# Patient Record
Sex: Female | Born: 1985 | Race: White | Hispanic: No | Marital: Married
Health system: Midwestern US, Academic
[De-identification: ages and names within clinical notes are randomized; demographics above are authoritative.]

---

## 2017-06-25 ENCOUNTER — Encounter: Admit: 2017-06-25 | Discharge: 2017-06-25 | Payer: BC Managed Care – PPO | Primary: Family

## 2017-06-25 ENCOUNTER — Encounter: Admit: 2017-06-25 | Discharge: 2017-06-26 | Payer: BC Managed Care – PPO | Primary: Family

## 2017-06-26 ENCOUNTER — Encounter: Admit: 2017-06-26 | Discharge: 2017-06-26 | Payer: BC Managed Care – PPO | Primary: Family

## 2017-06-26 ENCOUNTER — Encounter: Admit: 2017-06-26 | Discharge: 2017-06-27 | Payer: BC Managed Care – PPO | Primary: Family

## 2017-06-26 ENCOUNTER — Observation Stay: Admit: 2017-06-26 | Discharge: 2017-06-26 | Payer: BC Managed Care – PPO | Primary: Family

## 2017-06-26 DIAGNOSIS — T18108A Unspecified foreign body in esophagus causing other injury, initial encounter: ICD-10-CM

## 2017-06-26 LAB — URINALYSIS DIPSTICK
Lab: NEGATIVE
Lab: NEGATIVE
Lab: NEGATIVE
Lab: NEGATIVE
Lab: NEGATIVE

## 2017-06-26 LAB — CBC AND DIFF
Lab: 0 % (ref >60)
Lab: 0 10*3/uL (ref 0–0.20)
Lab: 0.2 10*3/uL (ref 0–0.45)
Lab: 0.5 10*3/uL (ref 0–0.80)
Lab: 13 % (ref 11–15)
Lab: 14 g/dL (ref 12.0–15.0)
Lab: 2 % (ref 0–5)
Lab: 259 10*3/uL (ref 150–400)
Lab: 3.3 10*3/uL (ref 1.0–4.8)
Lab: 32 pg (ref 26–34)
Lab: 33 % (ref 24–44)
Lab: 33 g/dL (ref 32.0–36.0)
Lab: 4.4 M/UL (ref 4.0–5.0)
Lab: 42 % (ref 36–45)
Lab: 5 % (ref 4–12)
Lab: 5.8 10*3/uL (ref >60)
Lab: 60 % (ref 41–77)
Lab: 7.9 FL (ref 7–11)
Lab: 9.8 10*3/uL (ref 4.5–11.0)
Lab: 95 FL (ref 80–100)

## 2017-06-26 LAB — URINALYSIS, MICROSCOPIC

## 2017-06-26 LAB — COMPREHENSIVE METABOLIC PANEL: Lab: 140 MMOL/L (ref 137–147)

## 2017-06-26 LAB — PTT (APTT): Lab: 25 s (ref 24.0–36.5)

## 2017-06-26 LAB — BETA-HCG: Lab: <1 U/L (ref <5)

## 2017-06-26 LAB — PROTIME INR (PT): Lab: 1 (ref 0.8–1.2)

## 2017-06-26 MED ORDER — ONDANSETRON HCL (PF) 4 MG/2 ML IJ SOLN
INTRAVENOUS | 0 refills | Status: DC
Start: 2017-06-26 — End: 2017-06-26
  Administered 2017-06-26: 21:00:00 4 mg via INTRAVENOUS

## 2017-06-26 MED ORDER — PROPOFOL INJ 10 MG/ML IV VIAL
0 refills | Status: DC
Start: 2017-06-26 — End: 2017-06-26
  Administered 2017-06-26: 20:00:00 150 mg via INTRAVENOUS

## 2017-06-26 MED ORDER — MIDAZOLAM 1 MG/ML IJ SOLN
INTRAVENOUS | 0 refills | Status: DC
Start: 2017-06-26 — End: 2017-06-26
  Administered 2017-06-26: 20:00:00 2 mg via INTRAVENOUS

## 2017-06-26 MED ORDER — FENTANYL CITRATE (PF) 50 MCG/ML IJ SOLN
0 refills | Status: DC
Start: 2017-06-26 — End: 2017-06-26

## 2017-06-26 MED ORDER — FENTANYL CITRATE (PF) 50 MCG/ML IJ SOLN
0 refills | Status: DC
Start: 2017-06-26 — End: 2017-06-26
  Administered 2017-06-26 (×2): 50 ug via INTRAVENOUS

## 2017-06-26 MED ORDER — PROPOFOL 10 MG/ML IV EMUL 50 ML (INFUSION)(AM)(OR)
0 refills | Status: DC
Start: 2017-06-26 — End: 2017-06-26
  Administered 2017-06-26: 20:00:00 100 ug/kg/min via INTRAVENOUS

## 2017-06-26 MED ORDER — LACTATED RINGERS IV SOLP
1000 mL | INTRAVENOUS | 0 refills | Status: DC
Start: 2017-06-26 — End: 2017-06-27

## 2017-06-26 MED ORDER — ROCURONIUM 10 MG/ML IV SOLN
INTRAVENOUS | 0 refills | Status: DC
Start: 2017-06-26 — End: 2017-06-26
  Administered 2017-06-26: 21:00:00 5 mg via INTRAVENOUS
  Administered 2017-06-26: 21:00:00 10 mg via INTRAVENOUS
  Administered 2017-06-26: 20:00:00 20 mg via INTRAVENOUS

## 2017-06-26 MED ORDER — LIDOCAINE HCL 4 % (40 MG/ML) MM SOLN
Freq: Once | TOPICAL | 0 refills | Status: DC
Start: 2017-06-26 — End: 2017-06-27

## 2017-06-26 MED ORDER — MORPHINE 2 MG/ML IV CRTG
2 mg | INTRAVENOUS | 0 refills | Status: DC | PRN
Start: 2017-06-26 — End: 2017-06-27
  Administered 2017-06-26: 15:00:00 2 mg via INTRAVENOUS

## 2017-06-26 MED ORDER — REMIFENTANYL 1000MCG IN NS 20ML (OR)
0 refills | Status: DC
Start: 2017-06-26 — End: 2017-06-26
  Administered 2017-06-26 (×2): .1 ug/kg/min via INTRAVENOUS

## 2017-06-26 MED ORDER — DEXAMETHASONE SODIUM PHOSPHATE 10 MG/ML IJ SOLN
10 mg | Freq: Once | INTRAVENOUS | 0 refills | Status: CP
Start: 2017-06-26 — End: ?
  Administered 2017-06-26: 18:00:00 10 mg via INTRAVENOUS

## 2017-06-26 MED ORDER — LIDOCAINE (PF) 10 MG/ML (1 %) IJ SOLN
.1-2 mL | INTRAMUSCULAR | 0 refills | Status: DC | PRN
Start: 2017-06-26 — End: 2017-06-26

## 2017-06-26 MED ORDER — OXYMETAZOLINE 0.05 % NA SPRY
2 | Freq: Once | NASAL | 0 refills | Status: DC
Start: 2017-06-26 — End: 2017-06-27

## 2017-06-26 MED ORDER — SUGAMMADEX 100 MG/ML IV SOLN
INTRAVENOUS | 0 refills | Status: DC
Start: 2017-06-26 — End: 2017-06-26
  Administered 2017-06-26: 21:00:00 154 mg via INTRAVENOUS

## 2017-06-26 MED ORDER — LIDOCAINE (PF) 200 MG/10 ML (2 %) IJ SYRG
0 refills | Status: DC
Start: 2017-06-26 — End: 2017-06-26
  Administered 2017-06-26: 20:00:00 70 mg via INTRAVENOUS

## 2017-06-26 MED ORDER — FAMOTIDINE (PF) 20 MG/2 ML IV SOLN
20 mg | Freq: Two times a day (BID) | INTRAVENOUS | 0 refills | Status: DC
Start: 2017-06-26 — End: 2017-06-27
  Administered 2017-06-26: 18:00:00 20 mg via INTRAVENOUS

## 2017-06-26 MED ORDER — DEXTRAN 70-HYPROMELLOSE (PF) 0.1-0.3 % OP DPET
0 refills | Status: DC
Start: 2017-06-26 — End: 2017-06-26
  Administered 2017-06-26: 20:00:00 2 [drp] via OPHTHALMIC

## 2017-06-26 MED ORDER — SODIUM CHLORIDE 0.9 % IV SOLP
INTRAVENOUS | 0 refills | Status: DC
Start: 2017-06-26 — End: 2017-06-27
  Administered 2017-06-26: 17:00:00 1000.000 mL via INTRAVENOUS

## 2017-06-26 MED ORDER — HYDROCODONE-ACETAMINOPHEN 5-325 MG PO TAB
1 | ORAL_TABLET | ORAL | 0 refills | 30.00000 days | Status: AC | PRN
Start: 2017-06-26 — End: ?

## 2017-06-26 MED ADMIN — LACTATED RINGERS IV SOLP [4318]: 1000 mL | INTRAVENOUS | @ 19:00:00 | Stop: 2017-06-26 | NDC 00338011704

## 2017-06-26 NOTE — Anesthesia Pre-Procedure Evaluation
Anesthesia Pre-Procedure Evaluation    Name: Janet Wright      MRN: [id number]     DOB: [birth year]     Age: 31 y.o.     Sex: female   __________________________________________________________________________     Procedure Date: 06/26/2017   Procedure: Procedure(s):  DIRECT MICROLARYNGOSCOPY REMOVAL FOREIGN BODY  ESOPHAGOSCOPY WITH REMOVAL FOREIGN BODY     Physical Assessment  Vital Signs (last filed in past 24 hours):  BP: 89/52 (12/21 1330)  Temp: 36.9 ???C (98.4 ???F) (12/21 1310)  Pulse: 67 (12/21 1310)  Respirations: 21 PER MINUTE (12/21 1310)  SpO2: 96 % (12/21 1310)  O2 Delivery: None (Room Air) (12/21 1310)  Height: 160 cm (62.99) (12/21 1018)  Weight: 77.2 kg (170 lb 3.2 oz) (12/21 1018)      Patient History  No Known Allergies     Current Medications    Medication Directions   cetirizine (ZYRTEC) 10 mg tablet Take 10 mg by mouth as Needed for Allergy symptoms.         Review of Systems/Medical History      Patient summary reviewed  Nursing notes reviewed  Pertinent labs reviewed    PONV Screening: Female gender and Non-smoker  No history of anesthetic complications  No family history of anesthetic complications      Airway - negative        Pulmonary - negative          Cardiovascular - negative        Exercise tolerance: >4 METS      GI/Hepatic/Renal         Foreign body stuck in throat      Neuro/Psych - negative        Musculoskeletal - negative        Endocrine/Other - negative     Physical Exam    Airway Findings      Mallampati: I      TM distance: >3 FB      Neck ROM: full      Mouth opening: good      Airway patency: adequate    Dental Findings: Negative      Cardiovascular Findings: Negative      Rhythm: regular      Rate: normal    Pulmonary Findings: Negative      Abdominal Findings: Negative      Neurological Findings: Negative         Diagnostic Tests  Hematology:   Lab Results   Component Value Date    HGB 14.2 06/26/2017    HCT 42.2 06/26/2017    PLTCT 259 06/26/2017 WBC 9.8 06/26/2017    NEUT 60 06/26/2017    ANC 5.80 06/26/2017    ALC 3.30 06/26/2017    MONA 5 06/26/2017    AMC 0.50 06/26/2017    EOSA 2 06/26/2017    ABC 0.00 06/26/2017    MCV 95.5 06/26/2017    MCH 32.1 06/26/2017    MCHC 33.6 06/26/2017    MPV 7.9 06/26/2017    RDW 13.1 06/26/2017         General Chemistry:   Lab Results   Component Value Date    NA 140 06/26/2017    K 3.8 06/26/2017    CL 108 06/26/2017    CO2 26 06/26/2017    GAP 6 06/26/2017    BUN 13 06/26/2017    CR 0.71 06/26/2017    GLU 93 06/26/2017    CA 9.4 06/26/2017  ALBUMIN 4.2 06/26/2017    TOTBILI 0.6 06/26/2017      Coagulation:   Lab Results   Component Value Date    PTT 25.4 06/26/2017    INR 1.0 06/26/2017         Anesthesia Plan    ASA score: 1 emergent   Plan: general  Induction method: intravenous  NPO status: acceptable      Informed Consent  Anesthetic plan and risks discussed with patient.  Use of blood products discussed with patient      Plan discussed with: CRNA and anesthesiologist.

## 2017-06-26 NOTE — Progress Notes
Dr Stevphen RochesterHansen at bedside to eval pt c/o 8/10 pain in mouth. Dr states she does not want to order narcotics for pt at this time. Pt encouraged to drink cool fluids and eat soft food.

## 2017-06-26 NOTE — Progress Notes
Dr Lanna PocheJ Allen from Gi at bedside. States would like KUB done before pt d/c'd. Radiology called for portable KUB

## 2017-06-26 NOTE — Other
Brief Operative Note    Name: Janet Wright is a 31 y.o. female     DOB: 07-08-1985             MRN#: [id number]  DATE OF OPERATION: 06/26/2017    Date:  06/26/2017        Preoperative Dx:   Foreign body in esophagus, initial encounter [T18.108A]    Post-op Diagnosis      * Foreign body in esophagus, initial encounter [T18.108A]    Procedure(s):  DIRECT MICROLARYNGOSCOPY REMOVAL FOREIGN BODY  ESOPHAGOSCOPY WITH REMOVAL FOREIGN BODY    Anesthesia Type: Defer to Anesthesia    Surgeon(s) and Role:     Aniceto Boss* Bond, Justin, MD - Primary     Lorene Dy* Nuriyah Hanline, MD - Resident - Assisting    Findings:  No evidence of airway/esophageal foreign body    Estimated Blood Loss: No blood loss documented.     Specimen(s) Removed/Disposition: * No specimens in log *    Complications:  None    Implants: None    Drains: None    Disposition:  PACU - stable    Lorene DyStephen Kaitelyn Jamison, MD  Pager [phone]

## 2017-06-26 NOTE — ED Notes
Pt was at home last night, and got a piece of pork bone caught in throat. The patient said it happened at 1745.  patient is unable to get it out herself. She tried drinking fluid and vomiting to get it out. The patient states she has vomited 5 times due to the location of the bone gagging her. She describes the pain as a sharp stabbing pain and rates it at a 6/10 and getting worse. Due to the location, it is also making her cough.     Belongings:  Geneticist, molecularhirt  Shoes  Sweatshirt  Jeans  sunglasses

## 2017-06-26 NOTE — H&P (View-Only)
Admission History and Physical Examination      Name:  Janet Wright                                             MRN:  [id number]   Admission Date:  06/26/2017                     Assessment/Plan:    Active Problems:    Esophageal foreign body, initial encounter    1.  Esophageal foreign body    Patient has a pork bone lodged just distal to the upper esophageal sphincter as demonstrated on lateral neck x-ray noted from outside hospital.  This is available in red packs to view.  Upper airway endoscopy without evidence of airway foreign body.    ???N.p.o. since 6 PM 06/25/17  ???Consented for direct laryngoscopy, esophagoscopy, foreign body removal  ???Added on to follow in [place] OR with Dr. Lottie Dawson  ???Risks and benefits of the procedure were discussed, including the rare possibility of esophageal perforation, injury to the lips, teeth, gums; bleeding, infection, pain; and risks associated with anesthesia; as well as the risk of additional procedures if retrieval is unsuccessful.    Patient was discussed with Dr. Lottie Dawson who formulated the plan of care.  Will admit to ENT team for observation.  Likely discharge postoperatively.    Alferd Patee, MD  Otolaryngology Resident, PGY-2  [phone]    __________________________________________________________________________________  Primary Care Physician: Antonietta Breach  Verified    Chief Complaint:  Pork bone lodged in throat  History of Present Illness: Janet Wright is a 31 y.o. female presenting as a transfer from outside hospital with chief complaint of esophageal foreign body.  Yesterday around dinnertime at 5:30 PM, patient was eating pork, when she noted that a sharp stabbing sensation consistent with a small piece of bone lodged in her throat.  She was unable to clear the sensation with swallowing.  She presented to outside emergency room where AP and lateral x-rays of the chest and neck were performed.  There is a small radiolucency just distal to the upper esophageal sphincter consistent with a bony foreign body.  She drank some water and ate a cracker at the outside ED attempting to clear the foreign body but was unable to do so.  Patient has no airway concerns and is breathing without issue.  She is resting comfortably.  All vitals have been stable throughout the episode.  Due to unavailability of GI and surgery staff at outside hospital, it was recommended that she be transferred to [facility] for further evaluation.  ENT team accepted overnight and evaluated patient for surgery.    No past medical history on file.  No past surgical history on file.  Family history reviewed; non-contributory  Social History     Socioeconomic History   ??? Marital status: Married     Spouse name: Not on file   ??? Number of children: Not on file   ??? Years of education: Not on file   ??? Highest education level: Not on file   Social Needs   ??? Financial resource strain: Not on file   ??? Food insecurity - worry: Not on file   ??? Food insecurity - inability: Not on file   ??? Transportation needs - medical: Not on file   ??? Transportation needs - non-medical: Not  on file   Occupational History   ??? Not on file   Tobacco Use   ??? Smoking status: Not on file   Substance and Sexual Activity   ??? Alcohol use: Not on file   ??? Drug use: Not on file   ??? Sexual activity: Not on file   Other Topics Concern   ??? Not on file   Social History Narrative   ??? Not on file      Immunizations (includes history and patient reported):   There is no immunization history on file for this patient.        Allergies:  Patient has no known allergies.    Medications:    (Not in a hospital admission)  Review of Systems:  A 14 point review of systems was negative except for: Sore throat, dysphagia    Physical Exam:  Vital Signs: Last Filed In 24 Hours Vital Signs: 24 Hour Range   BP: 114/78 (12/21 0300)  Temp: 36.5 ???C (97.7 ???F) (12/21 0220)  Pulse: 77 (12/21 0220) Respirations: 20 PER MINUTE (12/21 0220)  SpO2: 94 % (12/21 0300)  O2 Delivery: None (Room Air) (12/21 0220)  SpO2 Pulse: 73 (12/21 0300)  Height: 160 cm (63) (12/21 0220) BP: (114-125)/(75-85)   Temp:  [36.5 ???C (97.7 ???F)]   Pulse:  [77]   Respirations:  [20 PER MINUTE]   SpO2:  [94 %-100 %]   O2 Delivery: None (Room Air)   Intensity Pain Scale (Self Report): 6 (06/26/17 0221)      Physical Exam:  General appearance: [place] is in no distress and is alert and oriented  Communication ability: communicates by voice, normal quality  Neuro: Cranial nerves II-XII are intact  Head: normocephalic, no lacerations or lesions, no ecchymosis or edema, no tenderness to palpation over facial bones  Eyes:  Extraocular mobility is intact, pupils are equal and reactive bilaterally, conjunctiva and sclera normal  External ear: normal, no lesions or deformities  Otoscopic: canals clear, tympanic membranes intact, no fluid  Hearing: grossly intact  External nose: normal, no lesions or deformities, nares patent with no drainage  Nasal: mucosa and septum normal  Oral cavity: normal dentition with normal occlusion, no gingival inflammation, no lip or mucosal lesions, no palate mobility  Oropharynx: tongue normal, posterior pharynx without erythema or exudate, no old or active bleeding. Unable to visualize foreign body in oropharynx.  Neck: supple, no masses, trachea midline, No cervical adenopathy, thyromegaly, or parotid masses. Tender to palpation left neck posterior to angle of mandible  Respiratory: normal respiratory effort, no stridor    SEPARATE PROCEDURE - FLEXIBLE FIBEROPTIC LARYNGOSCOPY  After obtaining verbal consent and due to swallowing changes the nose was sprayed with 4% lidocaine and neosynephrine. The flexible fiberoptic laryngoscope was advanced into the right naris. The nasal anatomy is normal without mass or mucosal lesion. The laryngoscope was then passed into the nasopharynx, which showed normal eustachian tube openings.  The fossae of Rosenm???ller were clear with normal elevation of the soft palate and were without any evidence of masses or lesions.  Passing the flexible scope into the oropharynx and hypopharynx revealed that the base of tongue and vallecula were without lesions.  There is some blunting and effacement of the left pyriform sinus.  Advancing the scope distally into the hypopharynx via the pyriform sinus reproduces sharp pain for the patient. Unable to visualize the foreign object. The supraglottic structures are without lesions, including the epiglottis and aryepiglottic folds. The false vocal cords and  true vocal cords were without lesions. The TVC were symmetric and mobile bilaterally, without mucosal lesions. The visualized part of the subglottis is clear.  There was no extrinsic mass effects in the pharynx. The flexible fiberoptic scope was then removed.  The patient tolerated the procedure well without complications.   Impression: Effacement of the left pyriform sinus, pain when advancing scope into hypopharynx, unable to visualize foreign object. No airway foreign bodies seen.    Lab/Radiology/Other Diagnostic Tests:  24-hour labs:  No results found for this visit on 06/26/17 (from the past 24 hour(s)).     Pertinent radiology reviewed. Lateral neck XR with radiolucency consistent with bony foreign body.    Alferd Patee, MD   Otolaryngology Resident, PGY-2  [phone]

## 2017-06-26 NOTE — Progress Notes
Dr Lottie DawsonBond at bedside, [facility] Dr Lottie DawsonBond also states pt can go back to floor since her belongings are there and be d/c home if able to take soft foods and fluids.

## 2017-06-26 NOTE — Progress Notes
1800 Patient left unit ambulatory and accompanied by family members in no acute distress.

## 2017-06-26 NOTE — Operative Report(Direct Entry)
OPERATIVE REPORT    Name: Janet Wright is a 31 y.o. female     DOB: Feb 02, 1986             MRN#: [id number]    DATE OF OPERATION: 06/26/2017    Surgeon(s) and Role:     Aniceto Boss, MD - Primary     * Lorene Dy, MD - Resident - Assisting        Preoperative Diagnosis:    1. Odynophagia  2. Foreign body in esophagus, initial encounter [T18.108A]    Post-op Diagnosis   1. Odynophagia    Procedure(s):  DIRECT MICROLARYNGOSCOPY  ESOPHAGOSCOPY    Anesthesia Type: General endotracheal anesthesia    Indications for procedure: This is a 31 year old female who presented to the emergency department with chief complaint of a bone stuck in her throat after eating pork.  She had pain with swallowing and the sensation of something being stuck in her throat.  She had no airway complaints.  She had a lateral x-ray of the neck which showed radiopaque density in the post cricoid region that could be consistent with a foreign body.  Flexible fiberoptic laryngoscopy was performed, that showed no evidence of supraglottic foreign body.  She presents now for endoscopy and possible removal of foreign body.    Findings: Normal appearing supraglottis, glottis, right piriform sinus, postcricoid area, and esophagus.  Small ecchymotic area and increased mucosal edema in the left piriform sinus.  No evidence of foreign body.      Description and Findings of Operative Procedure: After discussing the risk and benefits of the procedure, informed consent was obtained.  The patient was taken to the operating room, she was placed on the operating table in supine position.  She was intubated by anesthesia with no sign of foreign body, and general endotracheal anesthesia was induced without difficulty.  The head of the bed was rotated 90 degrees away from anesthesia, a head wrap was placed in the usual fashion.  A maxillary tooth guard was placed and a Dedo laryngoscope was introduced transorally and used to perform direct laryngoscopy.  I carefully inspected the glottic, supraglottic, hypopharyngeal mucosa.  I saw no evidence of foreign body.  I took particular care to examine the piriform sinuses, and the post cricoid region as these were most likely the spots where the bone would have been lodged based on her x-ray.  Photodocumentation was obtained, using the 0 degree Hopkins rod telescope.  There was increased inflammation and mucus in the left piriform sinus, as well as a small area of ecchymosis that I interpret as the spot where the bone may have been lodged.  I palpated this area carefully with a suction and move the mucosa proximally and distally looking for any tenting or tethering of the mucosa that would indicate that the bone was stuck submucosally.  I then passed a rigid esophagoscope in through the mouth and identified the esophageal inlet the scope was then passed through the length of the cervical esophagus, visualizing the lumen in the entire way distally.  She had some reflux of gastric contents distally, but no evidence of foreign body.  The scope was then removed circumferentially examining the mucosa, photo documentation was obtained at points distally, at the mid esophagus, at the proximal esophagus/esophageal inlet.  Again no foreign body was seen when withdrawing the scope either.  At this point, I terminated the procedure, I turned the patient back over to anesthesia.  General anesthesia was reversed, the  patient was awakened in the operating room and taken to the postanesthesia care unit in stable condition.    At the end of the procedure all sponge and instrument counts were correct x2.  I was present for the entire procedure, and performed it with the assistance of Dr. Jack Quarto.    Estimated Blood Loss:  No blood loss documented.     Specimen(s) Removed/Disposition: * No specimens in log *    Attestation: I performed this procedure with a resident.    Aniceto Boss, MD

## 2017-06-26 NOTE — Progress Notes
Patient states she believes she has swallowed a foreign body "pork bone". Has [facility] ED [facility] hours and surgeon is going to be tied up in OR many more hours- said to transfer.  Called [facility] and GI there said no anesthesia available. Patient is able to swallow, overly active gag when swallowing.

## 2017-06-26 NOTE — Progress Notes
Pt drinking water without diff

## 2017-06-26 NOTE — Progress Notes
Attempt to call report to floor, floor states will call back

## 2017-06-26 NOTE — ED Notes
Report given to Gay FillerStefan, RN on CA6. No further questions at this time.

## 2017-06-26 NOTE — ED Notes
Assumed care of patient from Sydni, RN. No acute changes at this time. Patient resting comfortably. Cart in lowest locked position. Call light within reach. Will continue to monitor.

## 2017-06-26 NOTE — Progress Notes
KUB done    pt up to bathroom gait steady 1 RN assist

## 2017-06-26 NOTE — Consults
Gastroenterology Consult Note      Admission Date: 06/26/2017                                                LOS: 0 days    Reason for Consult:  Esophageal foreign body    Assessment/Recommendations  Janet Wright is a 31 y.o. female who presents after eating dinner at 1745 on 06/25/17 and developed food impaction.  Went to OSH ER and was found to have radiolucent object in her upper throat.  GI consulted for evaluation.      1.  Esophageal foreign body:  Evaluated by ENT in ER.  Upper airway endoscopy reported with no evidence of airway foreign body.  XR showed bone just distal to upper esophageal sphincter.  Based on location of foreign body, removal with flexible endoscope may be difficult due to inability to stabilize scope at the UES.  Discussed with ENT and plan is to take to OR for direct laryngoscopy and foreign body removal.    Discussed with attending on service, Dr. Janene Madeira.    Lawana Pai, MD  06/26/2017 6:00 AM   Gastroenterology and Hepatology Fellow  Pager: [phone]  ______________________________________________________________________    History of Present Illness:  Janet Wright is a 31 y.o. female who presents after eating dinner at 1745 on 06/25/17 and developed food impaction.  Went to OSH ER and was found to have radiolucent object in her upper throat.  No prior episode of food impaction.  Reports prior EGD to look for ulcer.  After swallowing bone, she tried several times to vomit bone back up as well as drinking water to flush down.  Reports continued sensation of impacted object in neck.  Denies any further emesis.  No fevers or chills.  No difficulty breathing.  Tolerating oral secretions.     PMH:  none    Social History:  Social History     Socioeconomic History   ??? Marital status: Married     Spouse name: Not on file   ??? Number of children: Not on file   ??? Years of education: Not on file   ??? Highest education level: Not on file   Social Needs ??? Financial resource strain: Not on file   ??? Food insecurity - worry: Not on file   ??? Food insecurity - inability: Not on file   ??? Transportation needs - medical: Not on file   ??? Transportation needs - non-medical: Not on file   Occupational History   ??? Not on file   Tobacco Use   ??? Smoking status: Not on file   Substance and Sexual Activity   ??? Alcohol use: Not on file   ??? Drug use: Not on file   ??? Sexual activity: Not on file   Other Topics Concern   ??? Not on file   Social History Narrative   ??? Not on file       Family History:  noncontributory    Allergies:    No Known Allergies    Admission Medications:  Scheduled Meds:  lidocaine (XYLOCAINE) 4 % (40 mg/mL) solution  Topical ONCE   oxymetazoline (AFRIN) 0.05 % nasal spray 2 spray 2 spray Each Nostril ONCE   Continuous Infusions:  PRN and Respiratory Meds:    Prior to admission Medications:  None    Review  of Systems:  A comprehensive review of systems was negative except per HPI    Physical Exam:  Vitals:    06/26/17 0220 06/26/17 0230 06/26/17 0300   BP: 125/85 121/75 114/78   Pulse: 77     Temp: 36.5 ???C (97.7 ???F)     SpO2: 100% 98% 94%   Weight: 74.8 kg (165 lb)     Height: 160 cm (63)       Physical Exam   Constitutional: She is oriented to person, place, and time and well-developed, well-nourished, and in no distress. No distress.   HENT:   Head: Normocephalic and atraumatic.   Eyes: Right eye exhibits no discharge. Left eye exhibits no discharge. No scleral icterus.   Cardiovascular: Normal rate and regular rhythm.   Pulmonary/Chest: Effort normal and breath sounds normal. No respiratory distress.   Abdominal: Soft. Bowel sounds are normal. She exhibits no distension. There is no tenderness.   Musculoskeletal: Normal range of motion. She exhibits no edema.   Lymphadenopathy:     She has no cervical adenopathy.   Neurological: She is alert and oriented to person, place, and time.   Skin: Skin is warm and dry. She is not diaphoretic. Psychiatric: Mood, memory, affect and judgment normal.   Vitals reviewed.    Labs:  24-hour labs:    Results for orders placed or performed during the hospital encounter of 06/26/17 (from the past 24 hour(s))   CBC AND DIFF    Collection Time: 06/26/17  6:05 AM   Result Value Ref Range    White Blood Cells 9.8 4.5 - 11.0 K/UL    RBC 4.42 4.0 - 5.0 M/UL    Hemoglobin 14.2 12.0 - 15.0 GM/DL    Hematocrit 46.9 36 - 45 %    MCV 95.5 80 - 100 FL    MCH 32.1 26 - 34 PG    MCHC 33.6 32.0 - 36.0 G/DL    RDW 62.9 11 - 15 %    Platelet Count 259 150 - 400 K/UL    MPV 7.9 7 - 11 FL    Neutrophils 60 41 - 77 %    Lymphocytes 33 24 - 44 %    Monocytes 5 4 - 12 %    Eosinophils 2 0 - 5 %    Basophils 0 0 - 2 %    Absolute Neutrophil Count 5.80 1.8 - 7.0 K/UL    Absolute Lymph Count 3.30 1.0 - 4.8 K/UL    Absolute Monocyte Count 0.50 0 - 0.80 K/UL    Absolute Eosinophil Count 0.20 0 - 0.45 K/UL    Absolute Basophil Count 0.00 0 - 0.20 K/UL        Imaging:  Outside XR reviewed

## 2017-06-26 NOTE — Anesthesia Post-Procedure Evaluation
Post-Anesthesia Evaluation    Name: Janet Wright      MRN: [id number]     DOB: 03/12/1986     Age: 31 y.o.     Sex: female   __________________________________________________________________________     Procedure Date: 06/26/2017  Procedure: Procedure(s):  DIRECT MICROLARYNGOSCOPY  ESOPHAGOSCOPY      Surgeon: Moishe SpiceSurgeon(s):  Aniceto BossBond, Justin, MD  Lorene DyMcHale, Stephen, MD    Post-Anesthesia Vitals  BP: 110/57 (12/21 1649)  Temp: 36.8 C (98.2 F) (12/21 1649)  Pulse: 75 (12/21 1649)  Respirations: 18 PER MINUTE (12/21 1649)  SpO2: 96 % (12/21 1649)  O2 Delivery: None (Room Air) (12/21 1649)  SpO2 Pulse: 74 (12/21 1615)      Post Anesthesia Evaluation Note    Evaluation location: pre/post  Patient participation: recovered; patient participated in evaluation  Level of consciousness: alert  Pain management: adequate    Hydration: normovolemia  Temperature: 36.0C - 38.4C  Airway patency: adequate    Perioperative Events  Perioperative events:  no       Post-op nausea and vomiting: no PONV    Postoperative Status  Cardiovascular status: hemodynamically stable  Respiratory status: spontaneous ventilation  Follow-up needed: none        Perioperative Events  Perioperative Event: No

## 2017-06-26 NOTE — ED Notes
[id number]: [id number]: [id number]. Please call Susana for report @ [phone]

## 2017-06-27 ENCOUNTER — Encounter: Admit: 2017-06-26 | Discharge: 2017-06-26 | Payer: BC Managed Care – PPO | Primary: Family

## 2017-06-27 DIAGNOSIS — T17228A Food in pharynx causing other injury, initial encounter: Principal | ICD-10-CM

## 2017-06-27 DIAGNOSIS — R69 Illness, unspecified: ICD-10-CM

## 2017-06-27 DIAGNOSIS — R1310 Dysphagia, unspecified: ICD-10-CM

## 2017-06-27 NOTE — Progress Notes
We briefly saw the patient in recovery.  Reportedly the pork bone had not been retrieved, fallen into the stomach.    KUB confirms it is likely in proximal small bowel by my read.    We instructed her to watch her stool for the bone and return if any abdominal pain, nausea or vomiting.    We also told her to see her primary care doctor next week and have an x-ray taken if she has not seeing the bone pass.     Zenaida NieceJames Eric Kacey Dysert, MD   GI/Hepatology Fellow [phone]

## 2017-07-01 ENCOUNTER — Encounter: Admit: 2017-07-01 | Discharge: 2017-07-01 | Payer: BC Managed Care – PPO | Primary: Family

## 2017-07-08 ENCOUNTER — Encounter: Admit: 2017-07-08 | Discharge: 2017-07-08 | Payer: BC Managed Care – PPO | Primary: Family

## 2017-07-17 ENCOUNTER — Encounter: Admit: 2017-07-17 | Discharge: 2017-07-17 | Payer: BC Managed Care – PPO | Primary: Family

## 2017-07-17 MED ORDER — DIPHENHYDRAMINE(#)/LIDOCAINE/ANTACID 1:1:1 PO SUSP
15 mL | Freq: Three times a day (TID) | ORAL | 3 refills | 30.00000 days | Status: AC | PRN
Start: 2017-07-17 — End: ?

## 2017-07-17 MED ORDER — DIPHENHYDRAMINE(#)/LIDOCAINE/ANTACID 1:1:1 PO SUSP
15 mL | Freq: Three times a day (TID) | ORAL | 3 refills | 30.00000 days | Status: AC | PRN
Start: 2017-07-17 — End: 2017-07-17

## 2017-07-30 ENCOUNTER — Ambulatory Visit: Admit: 2017-07-30 | Discharge: 2017-07-30 | Payer: BC Managed Care – PPO | Primary: Family

## 2017-07-30 ENCOUNTER — Encounter: Admit: 2017-07-30 | Discharge: 2017-07-30 | Payer: BC Managed Care – PPO | Primary: Family

## 2017-07-30 DIAGNOSIS — T18108D Unspecified foreign body in esophagus causing other injury, subsequent encounter: Principal | ICD-10-CM

## 2017-07-30 DIAGNOSIS — K121 Other forms of stomatitis: ICD-10-CM

## 2017-10-08 ENCOUNTER — Encounter: Admit: 2017-10-08 | Discharge: 2017-10-08 | Payer: BC Managed Care – PPO | Primary: Family

## 2019-06-08 IMAGING — CR CHEST
2 series · 2 of 2 positions shown · non-contrast
Comparison: none

[chest pa]
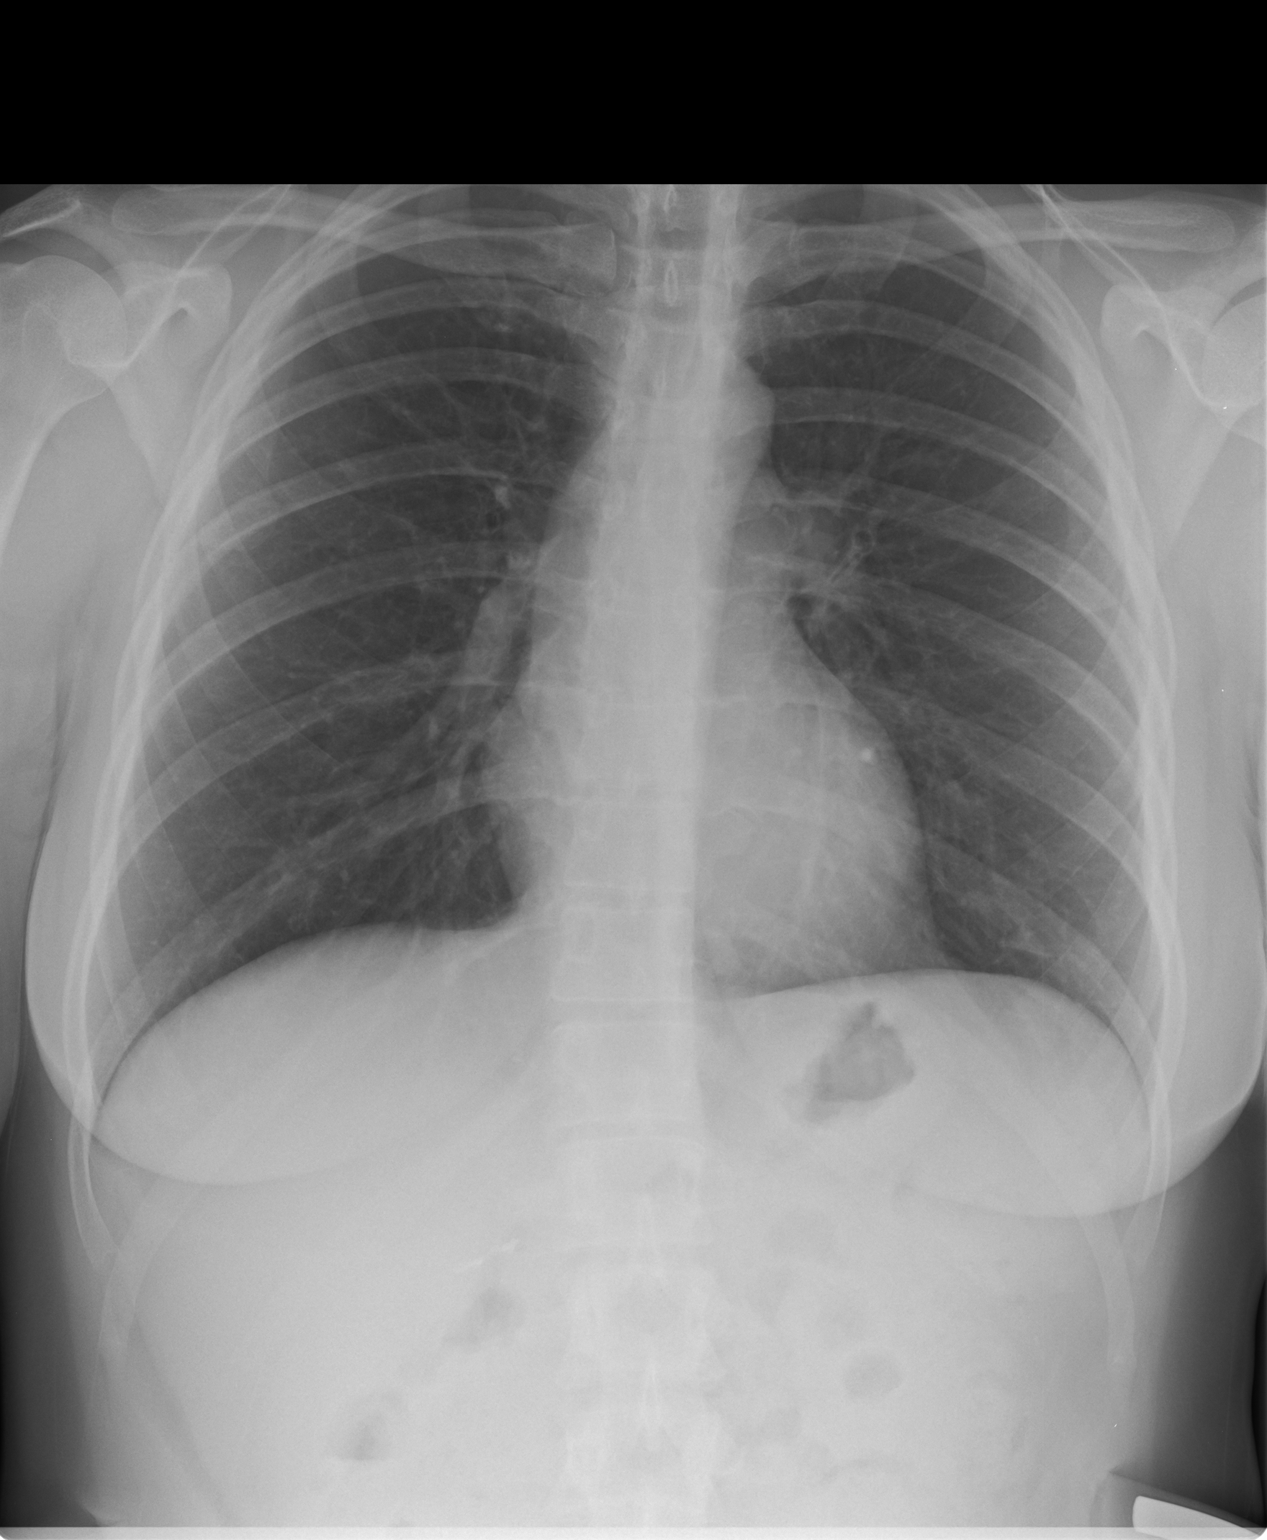

[chest lat]
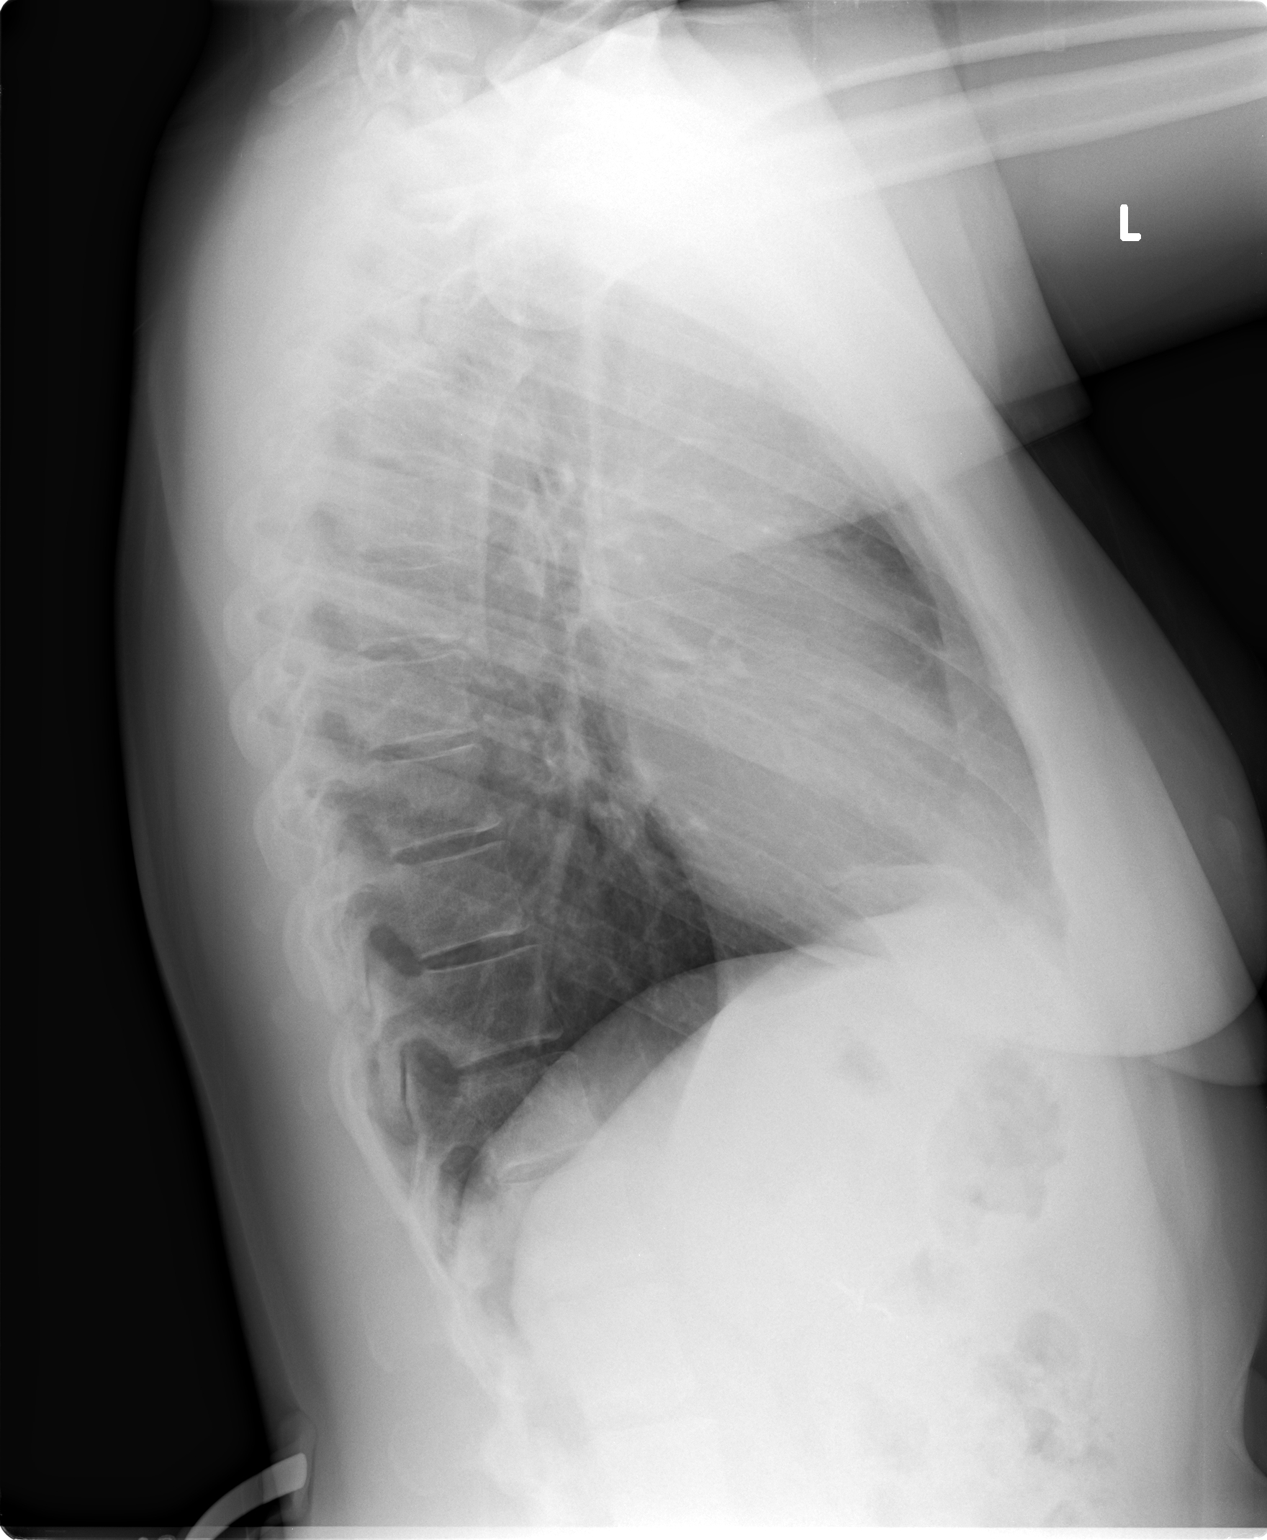

[2 of 2 positions shown; findings below may reference images not displayed]

DIAGNOSTIC STUDIES

EXAM

INDICATION
possible foreign body
POSSIBLE FOREIGN BODY. CHEST XRAY WANTED FROM GENERAL SURGEON. SHIELDED.

TECHNIQUE
Two-view

COMPARISONS
None

FINDINGS
Upper abdomen normal. Heart normal. Lungs clear. No focal lung consolidation. No pneumothorax

IMPRESSION
No acute process

## 2023-11-05 ENCOUNTER — Encounter: Admit: 2023-11-05 | Discharge: 2023-11-05 | Payer: BLUE CROSS/BLUE SHIELD | Primary: Family
